# Patient Record
Sex: Female | Born: 1975 | Race: Asian | Hispanic: No | Marital: Single | Smoking: Former smoker
Health system: Southern US, Community
[De-identification: ages and names within clinical notes are randomized; demographics above are authoritative.]

## PROBLEM LIST (undated history)

## (undated) HISTORY — PX: BREAST SURGERY: SHX581

---

## 1999-08-17 HISTORY — PX: BREAST EXCISIONAL BIOPSY: SUR124

## 2009-06-30 ENCOUNTER — Ambulatory Visit: Payer: Self-pay | Admitting: Occupational Medicine

## 2009-06-30 DIAGNOSIS — D509 Iron deficiency anemia, unspecified: Secondary | ICD-10-CM | POA: Insufficient documentation

## 2009-06-30 DIAGNOSIS — J45909 Unspecified asthma, uncomplicated: Secondary | ICD-10-CM | POA: Insufficient documentation

## 2009-06-30 DIAGNOSIS — M79609 Pain in unspecified limb: Secondary | ICD-10-CM

## 2009-12-26 ENCOUNTER — Other Ambulatory Visit: Admission: RE | Admit: 2009-12-26 | Discharge: 2009-12-26 | Payer: Self-pay | Admitting: Family Medicine

## 2011-03-25 ENCOUNTER — Other Ambulatory Visit (HOSPITAL_COMMUNITY)
Admission: RE | Admit: 2011-03-25 | Discharge: 2011-03-25 | Disposition: A | Discharge status: Home / Self Care | Payer: BC Managed Care – PPO | Source: Ambulatory Visit | Attending: Obstetrics and Gynecology | Admitting: Obstetrics and Gynecology

## 2011-03-25 DIAGNOSIS — Z01419 Encounter for gynecological examination (general) (routine) without abnormal findings: Secondary | ICD-10-CM | POA: Insufficient documentation

## 2013-03-28 ENCOUNTER — Encounter: Payer: Self-pay | Admitting: Physical Medicine & Rehabilitation

## 2013-04-20 ENCOUNTER — Encounter: Payer: Self-pay | Admitting: Physical Medicine & Rehabilitation

## 2013-04-20 ENCOUNTER — Ambulatory Visit (HOSPITAL_BASED_OUTPATIENT_CLINIC_OR_DEPARTMENT_OTHER): Payer: Worker's Compensation | Admitting: Physical Medicine & Rehabilitation

## 2013-04-20 ENCOUNTER — Encounter: Payer: Worker's Compensation | Attending: Physical Medicine & Rehabilitation

## 2013-04-20 VITALS — BP 121/76 | HR 66 | Resp 14 | Ht 65.0 in | Wt 165.0 lb

## 2013-04-20 DIAGNOSIS — Y990 Civilian activity done for income or pay: Secondary | ICD-10-CM | POA: Insufficient documentation

## 2013-04-20 DIAGNOSIS — Y92009 Unspecified place in unspecified non-institutional (private) residence as the place of occurrence of the external cause: Secondary | ICD-10-CM | POA: Insufficient documentation

## 2013-04-20 DIAGNOSIS — M549 Dorsalgia, unspecified: Secondary | ICD-10-CM | POA: Insufficient documentation

## 2013-04-20 DIAGNOSIS — S336XXA Sprain of sacroiliac joint, initial encounter: Secondary | ICD-10-CM

## 2013-04-20 DIAGNOSIS — IMO0002 Reserved for concepts with insufficient information to code with codable children: Secondary | ICD-10-CM | POA: Insufficient documentation

## 2013-04-20 DIAGNOSIS — M545 Low back pain, unspecified: Secondary | ICD-10-CM | POA: Insufficient documentation

## 2013-04-20 DIAGNOSIS — G5702 Lesion of sciatic nerve, left lower limb: Secondary | ICD-10-CM | POA: Insufficient documentation

## 2013-04-20 DIAGNOSIS — IMO0001 Reserved for inherently not codable concepts without codable children: Secondary | ICD-10-CM | POA: Insufficient documentation

## 2013-04-20 DIAGNOSIS — M25559 Pain in unspecified hip: Secondary | ICD-10-CM | POA: Insufficient documentation

## 2013-04-20 DIAGNOSIS — Y93F2 Activity, caregiving, lifting: Secondary | ICD-10-CM | POA: Insufficient documentation

## 2013-04-20 DIAGNOSIS — G5700 Lesion of sciatic nerve, unspecified lower limb: Secondary | ICD-10-CM

## 2013-04-20 DIAGNOSIS — R209 Unspecified disturbances of skin sensation: Secondary | ICD-10-CM | POA: Insufficient documentation

## 2013-04-20 DIAGNOSIS — R42 Dizziness and giddiness: Secondary | ICD-10-CM | POA: Insufficient documentation

## 2013-04-20 DIAGNOSIS — X500XXA Overexertion from strenuous movement or load, initial encounter: Secondary | ICD-10-CM | POA: Insufficient documentation

## 2013-04-20 NOTE — Progress Notes (Signed)
Subjective:    Patient ID: Sharon Hahn, female    DOB: 1976-01-31, 37 y.o.   MRN: [id number] CC:  Left buttocks pain that radiates into the hip and then into the left heel. Onset 02/12/2013. 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 Pain started while attempting to transfer patient from supine to sit. The patient fell backwards, while trying to transfer and patient felt immediate onset of pain. The patient went to occupational health clinic [facility] for evaluation. Released to light duty on 02/13/2013. Patient was also treated with ibuprofen and Flexeril as well as moist heat. Had two prednisone dose packs which were of mild benefit  Has not worked since injury. No light work available. Overall symptoms have been improving. No physical therapy thus far.  Took 1 or 2 ibuprofen. Flexeril seems to work better Pain Inventory Average Pain 3 Pain Right Now 2 My pain is intermittent, aching and numbness  In the last 24 hours, has pain interfered with the following? General activity 1 Relation with others 0 Enjoyment of life 1 What TIME of day is your pain at its worst? daytime Sleep (in general) Good  Pain is worse with: bending and sitting Pain improves with: rest, heat/ice and medication Relief from Meds: 0  Mobility walk without assistance how many minutes can you walk? 10-15 ability to climb steps?  yes do you drive?  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  no MR SPINE LUMBAR WO CONTRAST7/31/2014  [facility]  MR SPINE LUMBAR WO CONTRAST7/31/2014  [facility]  Result Impression   No significant spinal canal or foraminal stenosis within the lumbar spine.   Result Narrative  MRI LUMBAR SPINE WITHOUT CONTRAST, Mar 15, 2013 11:05:00 PM . INDICATION:  Sciatica, unspecified  laterality724.3 Sciatica, unspecified laterality  COMPARISON: None. . TECHNIQUE: Multiplanar, multi-sequence surface-coil MR imaging of the lumbar spine was performed without contrast. . LEVELS IMAGED: Lower thoracic to upper sacral region . FINDINGS: .  Alignment: Normal. .  Vertebrae: No marrow signal abnormalities to suggest neoplasm. .  Conus: Conus terminates at L1..  Normal signal and contour.   .  Degenerative disc disease:. .  T12-L1: No significant focal abnormality. .  L1-L2: No significant focal abnormality. .  L2-L3: No significant focal abnormality. Marland Kitchen  L3-L4: Bilateral facet joint effusions. No significant stenosis. Marland Kitchen  L4-L5: Bilateral facet joint effusions. No significant stenosis.. .  L5-S1: No significant focal abnormality. Marland Kitchen  Upper Sacrum/Ilium: No significant focal abnormality.    Physicians involved in your care Any changes since last visit?  no   History reviewed. No pertinent family history. History   Social History  . Marital Status: Single    Spouse Name: N/A    Number of Children: N/A  . Years of Education: N/A   Social History Main Topics  . Smoking status: Former Games developer  . Smokeless tobacco: Never Used  . Alcohol Use: None  . Drug Use: None  . Sexual Activity: None   Other Topics Concern  . None   Social History Narrative  . None   No past surgical history on file. History reviewed. No pertinent past medical history. BP 121/76  Pulse 66  Resp 14  Ht 5\' 5"  (1.651 m)  Wt 165 lb (74.844 kg)  BMI 27.46 kg/m2  SpO2 100%     Review  of Systems  Musculoskeletal: Positive for back pain.  Neurological: Positive for dizziness and numbness.  All other systems reviewed and are negative.       Objective:   Physical Exam  Nursing note and vitals reviewed. Constitutional: She appears well-developed and well-nourished.  HENT:  Head: Normocephalic and atraumatic.  Eyes: Conjunctivae and EOM are normal. Pupils are equal, round, and  reactive to light.  Neck: Normal range of motion.  Musculoskeletal:       Lumbar back: She exhibits decreased range of motion and tenderness. She exhibits no edema and no deformity.  Tenderness over her left quadratus lumborum Tenderness over left gluteus medius Tenderness over left gluteus maximus plus minus piriformis Tenderness over left greater trochanter  Mild tenderness right gluteus medius Mild tenderness right greater trochanter  Lumbar spine range of motion 75% flexion 50% lateral bending to the left and to the right 50% extension  Psychiatric: She has a normal mood and affect.          Assessment & Plan:  1. Myofascial pain syndrome after work related injury. She has primarily muscle pain in the left gluteus region. Piriformis syndrome is a consideration because she has some sciatic type symptoms on the left side. MRI showed lumbar facet fluid bilateral L3-4 and L. 4- 5 which may have been related to the injury however certainly does not appear to have any symptoms at that level.  She has reduced lumbar range of motion which is secondary to deconditioning. She does not have pain limiting her end points. Recommended physical therapy for stretching strengthening and reconditioning. Goal is to return to work at full capacity.  If patient fails to respond to physical therapy as expected, may need trochanteric bursa injection plus minus sacroiliac injection  Light duty restrictions. 20 pound maximum occasional lifting. No sitting or standing restrictions. No upper extremity restrictions.  Physical therapy prescribed No need for repeat MRI or repeat imaging No need for injections at this point however if patient does not respond well to physical therapy may need to consider this Patient has not reached MMI  Case was discussed with patient as well as workers Science writer Rico Sheehan fax #[phone]

## 2013-04-20 NOTE — Patient Instructions (Addendum)
Light duty restrictions. 20 pound maximum occasional lifting. No sitting or standing restrictions. No upper extremity restrictions.  Physical therapy prescribed No need for repeat MRI or repeat imaging No need for injections at this point however if patient does not respond well to physical therapy may need to consider this Patient has not reached MMI

## 2013-05-11 ENCOUNTER — Encounter: Payer: Self-pay | Admitting: Physical Medicine & Rehabilitation

## 2013-05-11 ENCOUNTER — Ambulatory Visit (HOSPITAL_BASED_OUTPATIENT_CLINIC_OR_DEPARTMENT_OTHER): Payer: Worker's Compensation | Admitting: Physical Medicine & Rehabilitation

## 2013-05-11 VITALS — BP 112/67 | HR 62 | Resp 14 | Ht 65.0 in | Wt 168.4 lb

## 2013-05-11 DIAGNOSIS — S336XXD Sprain of sacroiliac joint, subsequent encounter: Secondary | ICD-10-CM

## 2013-05-11 DIAGNOSIS — Z5189 Encounter for other specified aftercare: Secondary | ICD-10-CM

## 2013-05-11 DIAGNOSIS — G5700 Lesion of sciatic nerve, unspecified lower limb: Secondary | ICD-10-CM

## 2013-05-11 DIAGNOSIS — S76012D Strain of muscle, fascia and tendon of left hip, subsequent encounter: Secondary | ICD-10-CM

## 2013-05-11 DIAGNOSIS — G5702 Lesion of sciatic nerve, left lower limb: Secondary | ICD-10-CM

## 2013-05-11 NOTE — Progress Notes (Signed)
Subjective:    Patient ID: Sharon Hahn, female    DOB: 01-03-1976, 37 y.o.   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 The patient fell backwards, while trying to transfer and patient felt immediate onset of pain.  The patient went to occupational health clinic [facility] for evaluation. Released to light duty on 02/13/2013. Patient was also treated with ibuprofen and Flexeril as well as moist heat. Had two prednisone dose packs which were of mild benefit    HPI Has had 8/9 physical therapy visits. Overall feeling better. Only occasional numbness in the left leg. Improving flexibility. Has returned to work on a light duty basis. She is just doing paper work. She states she would prefer going back to her regular job.  Rarely taking any cyclobenzaprine or ibuprofen.  Pain Inventory Average Pain 2 Pain Right Now 0 My pain is dull  In the last 24 hours, has pain interfered with the following? General activity 0 Relation with others 0 Enjoyment of life 0 What TIME of day is your pain at its worst? evening Sleep (in general) Good  Pain is worse with: some activites Pain improves with: heat/ice, therapy/exercise and TENS Relief from Meds: 0  Mobility walk without assistance how many minutes can you walk? 40 ability to climb steps?  yes do you drive?  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  no  Physicians involved in your care Any changes since last visit?  no   History reviewed. No pertinent family history. History   Social History  . Marital Status: Single    Spouse Name: N/A    Number of Children: N/A  . Years of Education: N/A   Social History Main Topics  . Smoking status: Former Games developer  . Smokeless tobacco: Never Used  . Alcohol Use: None   . Drug Use: None  . Sexual Activity: None   Other Topics Concern  . None   Social History Narrative  . None   History reviewed. No pertinent past surgical history. History reviewed. No pertinent past medical history. BP 112/67  Pulse 62  Resp 14  Ht 5\' 5"  (1.651 m)  Wt 168 lb 6.4 oz (76.386 kg)  BMI 28.02 kg/m2  SpO2 99%  LMP 05/11/2013     Review of Systems  Neurological: Positive for dizziness.       Objective:   Physical Exam  Nursing note and vitals reviewed.  Constitutional: She appears well-developed and well-nourished.  HENT:  Head: Normocephalic and atraumatic.  Eyes: Conjunctivae and EOM are normal. Pupils are equal, round, and reactive to light.  Neck: Normal range of motion.  Musculoskeletal:  Lumbar back: She exhibits decreased range of motion and tenderness. She exhibits no edema and no deformity.  No tenderness over her left quadratus lumborum Mild tenderness over left gluteus medius Mild tenderness over left gluteus maximus plus minus piriformis Mild/mod tenderness over left greater trochanter  No tenderness right gluteus medius No tenderness right greater trochanter  Lumbar spine range of motion 100% flexion 75% lateral bending to the left and to the right 50% extension  Psychiatric: She has a normal mood and affect.        Assessment & Plan:  1. Myofascial pain syndrome after work related injury. She has primarily muscle pain in the left  gluteus region. Piriformis syndrome is a consideration because she has some sciatic type symptoms on the left side. MRI showed lumbar facet fluid bilateral L3-4 and L. 4- 5 which may have been related to the injury however certainly does not appear to have any symptoms at that level.  She has reduced lumbar range of motion which is secondary to deconditioning and improving with therapy. She does not have pain limiting her end points.  Recommended continue physical therapy for work conditioning and works  simulation. Goal is to return to work at full capacity. May be ready after next visit If patient fails to respond to physical therapy as expected, may need trochanteric bursa injection plus minus sacroiliac injection  Light duty restrictions. 20 pound maximum occasional lifting. No sitting or standing restrictions. No upper extremity restrictions.  Physical therapy prescribed  No need for repeat MRI or repeat imaging  No need for injections at this point however if patient does not respond well to physical therapy may need to consider this  Patient has not reached MMI  Case was discussed with patient as well as workers compensation case manager Rico Sheehan fax #[phone] Over half of the 25 min visit was spent counseling and coordinating care.

## 2013-05-11 NOTE — Patient Instructions (Addendum)
Continue current restrictions at 20 pound occasional maximum lifting  We will discuss return to the usual duties next visit if you continue to make good progress.  I would like physical therapy to do some job stimulation

## 2013-05-25 ENCOUNTER — Ambulatory Visit (HOSPITAL_BASED_OUTPATIENT_CLINIC_OR_DEPARTMENT_OTHER): Payer: Worker's Compensation | Admitting: Physical Medicine & Rehabilitation

## 2013-05-25 ENCOUNTER — Encounter: Payer: Self-pay | Admitting: Physical Medicine & Rehabilitation

## 2013-05-25 ENCOUNTER — Encounter: Payer: BC Managed Care – PPO | Attending: Physical Medicine & Rehabilitation

## 2013-05-25 VITALS — BP 126/70 | HR 88 | Resp 14 | Ht 65.0 in | Wt 167.0 lb

## 2013-05-25 DIAGNOSIS — IMO0001 Reserved for inherently not codable concepts without codable children: Secondary | ICD-10-CM | POA: Insufficient documentation

## 2013-05-25 NOTE — Progress Notes (Signed)
Subjective:    Patient ID: Sharon Hahn, female    DOB: 01-29-1976, 37 y.o.   MRN: [id number]  HPI Finished physical therapy yesterday Now is doing independent exercise program Has been doing work conditioning as well as job stimulation with transfers and other activities  Has taken 1 Flexeril in the last 2 weeks Has taken ibuprofen twice in the last 2 weeks after her workout  Pain Inventory Average Pain 2 Pain Right Now 0 My pain is intermittent and sore  In the last 24 hours, has pain interfered with the following? General activity 0 Relation with others 0 Enjoyment of life 0 What TIME of day is your pain at its worst? evening Sleep (in general) Good  Pain is worse with: exercise Pain improves with: rest, heat/ice and therapy/exercise Relief from Meds: ibuprofen only as needed  Mobility walk without assistance how many minutes can you walk? 45 ability to climb steps?  yes do you drive?  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  no  Physicians involved in your care Any changes since last visit?  no   Family History  Problem Relation Age of Onset  . Heart disease Mother   . Hyperlipidemia Mother   . Hypertension Mother   . Heart disease Father   . Hyperlipidemia Father   . Hypertension Father    History   Social History  . Marital Status: Single    Spouse Name: N/A    Number of Children: N/A  . Years of Education: N/A   Social History Main Topics  . Smoking status: Former Games developer  . Smokeless tobacco: Never Used  . Alcohol Use: None  . Drug Use: None  . Sexual Activity: None   Other Topics Concern  . None   Social History Narrative  . None   Past Surgical History  Procedure Laterality Date  . Breast surgery     History reviewed. No pertinent past medical history. BP 126/70  Pulse 88  Resp 14  Ht 5\' 5"  (1.651 m)  Wt 167 lb (75.751 kg)   BMI 27.79 kg/m2  SpO2 100%  LMP 05/11/2013      Review of Systems  All other systems reviewed and are negative.       Objective:   Physical Exam  Nursing note and vitals reviewed.  Constitutional: She appears well-developed and well-nourished.  HENT:  Head: Normocephalic and atraumatic.  Eyes: Conjunctivae and EOM are normal. Pupils are equal, round, and reactive to light.  Neck: Normal range of motion.  Musculoskeletal:  Lumbar back: She exhibits decreased range of motion and tenderness. She exhibits no edema and no deformity.  No tenderness over her left quadratus lumborum no tenderness over left gluteus medius no tenderness over left gluteus maximus plus minus piriformis Mild tenderness over left greater trochanter  No tenderness right gluteus medius No tenderness right greater trochanter  Lumbar spine range of motion 100% flexion 75% lateral bending to the left and to the right 75% extension  Psychiatric: She has a normal mood and affect.        Assessment & Plan:  1. Myofascial pain syndrome after work related injury. She has primarily muscle pain in the left gluteus region. Marland Kitchen MRI showed lumbar facet fluid bilateral L3-4 and L. 4- 5 which may have been related to the injury however certainly does not appear to have any symptoms at that level.  She had reduced lumbar range of motion  improved with therapy. She does not have pain limiting her end points.  Recommended continue physical therapy for work conditioning and works simulation.   return to work at full capacity. 1/2 days for 1 week then resume normal schedule  Patient to call if unable to tolerate full schedule   No need for repeat MRI or repeat imaging  No need for injections at this point or physical therapy , continue home eercise program Case was discussed with patient as well as workers compensation case manager Rico Sheehan fax #[phone]  Over half of the 25 min visit was spent counseling  and coordinating care.  Marland Kitchen

## 2013-05-25 NOTE — Patient Instructions (Addendum)
Return to work 4 hour days without restrictions for 1 week Then resume normal schedule without restrictions  Return to clinic as needed if pain worsens  0% partial permanent disability rating  Discontinue Flexeril  May take ibuprofen 400 mg 3 times per day with food as needed

## 2013-08-23 ENCOUNTER — Other Ambulatory Visit (HOSPITAL_COMMUNITY)
Admission: RE | Admit: 2013-08-23 | Discharge: 2013-08-23 | Disposition: A | Discharge status: Home / Self Care | Payer: BC Managed Care – PPO | Source: Ambulatory Visit | Attending: Family Medicine | Admitting: Family Medicine

## 2013-08-23 ENCOUNTER — Other Ambulatory Visit: Payer: Self-pay | Admitting: Family Medicine

## 2013-08-23 DIAGNOSIS — Z113 Encounter for screening for infections with a predominantly sexual mode of transmission: Secondary | ICD-10-CM | POA: Insufficient documentation

## 2013-08-23 DIAGNOSIS — Z1151 Encounter for screening for human papillomavirus (HPV): Secondary | ICD-10-CM | POA: Insufficient documentation

## 2013-08-23 DIAGNOSIS — Z124 Encounter for screening for malignant neoplasm of cervix: Secondary | ICD-10-CM | POA: Insufficient documentation

## 2013-08-23 DIAGNOSIS — R87810 Cervical high risk human papillomavirus (HPV) DNA test positive: Secondary | ICD-10-CM | POA: Insufficient documentation

## 2013-08-29 ENCOUNTER — Other Ambulatory Visit: Payer: Self-pay | Admitting: Family Medicine

## 2013-08-29 DIAGNOSIS — R234 Changes in skin texture: Secondary | ICD-10-CM

## 2013-09-06 ENCOUNTER — Other Ambulatory Visit: Payer: Self-pay

## 2013-09-26 ENCOUNTER — Ambulatory Visit
Admission: RE | Admit: 2013-09-26 | Discharge: 2013-09-26 | Disposition: A | Discharge status: Home / Self Care | Payer: BC Managed Care – PPO | Source: Ambulatory Visit | Attending: Family Medicine | Admitting: Family Medicine

## 2013-09-26 DIAGNOSIS — R234 Changes in skin texture: Secondary | ICD-10-CM

## 2016-01-02 ENCOUNTER — Other Ambulatory Visit: Payer: Self-pay

## 2016-01-02 DIAGNOSIS — Z1231 Encounter for screening mammogram for malignant neoplasm of breast: Secondary | ICD-10-CM

## 2016-02-03 ENCOUNTER — Ambulatory Visit
Admission: RE | Admit: 2016-02-03 | Discharge: 2016-02-03 | Disposition: A | Discharge status: Home / Self Care | Payer: 59 | Source: Ambulatory Visit

## 2016-02-03 DIAGNOSIS — Z1231 Encounter for screening mammogram for malignant neoplasm of breast: Secondary | ICD-10-CM

## 2017-08-05 ENCOUNTER — Other Ambulatory Visit: Payer: Self-pay | Admitting: Family Medicine

## 2017-08-05 DIAGNOSIS — N644 Mastodynia: Secondary | ICD-10-CM

## 2017-08-15 ENCOUNTER — Ambulatory Visit
Admission: RE | Admit: 2017-08-15 | Discharge: 2017-08-15 | Disposition: A | Discharge status: Home / Self Care | Payer: 59 | Source: Ambulatory Visit | Attending: Family Medicine | Admitting: Family Medicine

## 2017-08-15 DIAGNOSIS — N644 Mastodynia: Secondary | ICD-10-CM

## 2018-03-06 ENCOUNTER — Other Ambulatory Visit (HOSPITAL_COMMUNITY)
Admission: RE | Admit: 2018-03-06 | Discharge: 2018-03-06 | Disposition: A | Discharge status: Home / Self Care | Payer: 59 | Source: Ambulatory Visit | Attending: Family Medicine | Admitting: Family Medicine

## 2018-03-06 ENCOUNTER — Other Ambulatory Visit: Payer: Self-pay | Admitting: Family Medicine

## 2018-03-06 DIAGNOSIS — Z01411 Encounter for gynecological examination (general) (routine) with abnormal findings: Secondary | ICD-10-CM | POA: Diagnosis present

## 2018-03-08 LAB — CYTOLOGY - PAP: Diagnosis: NEGATIVE

## 2018-10-25 ENCOUNTER — Other Ambulatory Visit: Payer: Self-pay | Admitting: Family Medicine

## 2018-10-25 DIAGNOSIS — Z1231 Encounter for screening mammogram for malignant neoplasm of breast: Secondary | ICD-10-CM

## 2018-11-15 ENCOUNTER — Other Ambulatory Visit: Payer: Self-pay | Admitting: Family Medicine

## 2018-11-15 DIAGNOSIS — Z1231 Encounter for screening mammogram for malignant neoplasm of breast: Secondary | ICD-10-CM

## 2019-01-03 ENCOUNTER — Other Ambulatory Visit: Payer: Self-pay

## 2019-01-03 ENCOUNTER — Ambulatory Visit
Admission: RE | Admit: 2019-01-03 | Discharge: 2019-01-03 | Disposition: A | Discharge status: Home / Self Care | Payer: 59 | Source: Ambulatory Visit | Attending: Family Medicine | Admitting: Family Medicine

## 2019-01-03 DIAGNOSIS — Z1231 Encounter for screening mammogram for malignant neoplasm of breast: Secondary | ICD-10-CM

## 2019-01-09 ENCOUNTER — Ambulatory Visit: Payer: Self-pay

## 2020-06-19 DIAGNOSIS — Z20822 Contact with and (suspected) exposure to covid-19: Secondary | ICD-10-CM | POA: Diagnosis not present

## 2020-07-23 DIAGNOSIS — Z23 Encounter for immunization: Secondary | ICD-10-CM | POA: Diagnosis not present

## 2020-07-23 DIAGNOSIS — Z8639 Personal history of other endocrine, nutritional and metabolic disease: Secondary | ICD-10-CM | POA: Diagnosis not present

## 2020-07-23 DIAGNOSIS — Z0000 Encounter for general adult medical examination without abnormal findings: Secondary | ICD-10-CM | POA: Diagnosis not present

## 2021-06-01 ENCOUNTER — Other Ambulatory Visit: Payer: Self-pay | Admitting: Family Medicine

## 2021-06-01 DIAGNOSIS — Z1231 Encounter for screening mammogram for malignant neoplasm of breast: Secondary | ICD-10-CM

## 2021-06-05 DIAGNOSIS — L709 Acne, unspecified: Secondary | ICD-10-CM | POA: Diagnosis not present

## 2021-06-05 DIAGNOSIS — N644 Mastodynia: Secondary | ICD-10-CM | POA: Diagnosis not present

## 2021-06-05 DIAGNOSIS — M549 Dorsalgia, unspecified: Secondary | ICD-10-CM | POA: Diagnosis not present

## 2021-06-05 DIAGNOSIS — Z23 Encounter for immunization: Secondary | ICD-10-CM | POA: Diagnosis not present

## 2021-06-12 ENCOUNTER — Other Ambulatory Visit: Payer: Self-pay | Admitting: Family Medicine

## 2021-06-12 DIAGNOSIS — N644 Mastodynia: Secondary | ICD-10-CM

## 2021-07-01 ENCOUNTER — Ambulatory Visit
Admission: RE | Admit: 2021-07-01 | Discharge: 2021-07-01 | Disposition: A | Discharge status: Home / Self Care | Payer: Self-pay | Source: Ambulatory Visit | Attending: Family Medicine | Admitting: Family Medicine

## 2021-07-01 ENCOUNTER — Other Ambulatory Visit: Payer: Self-pay

## 2021-07-01 ENCOUNTER — Ambulatory Visit: Admission: RE | Admit: 2021-07-01 | Payer: Self-pay | Source: Ambulatory Visit

## 2021-07-01 DIAGNOSIS — R922 Inconclusive mammogram: Secondary | ICD-10-CM | POA: Diagnosis not present

## 2021-07-01 DIAGNOSIS — N644 Mastodynia: Secondary | ICD-10-CM | POA: Diagnosis not present

## 2021-10-02 DIAGNOSIS — Z0000 Encounter for general adult medical examination without abnormal findings: Secondary | ICD-10-CM | POA: Diagnosis not present

## 2021-10-06 DIAGNOSIS — E559 Vitamin D deficiency, unspecified: Secondary | ICD-10-CM | POA: Diagnosis not present

## 2021-10-06 DIAGNOSIS — I10 Essential (primary) hypertension: Secondary | ICD-10-CM | POA: Diagnosis not present

## 2022-01-18 DIAGNOSIS — G5600 Carpal tunnel syndrome, unspecified upper limb: Secondary | ICD-10-CM | POA: Diagnosis not present

## 2022-01-18 DIAGNOSIS — L723 Sebaceous cyst: Secondary | ICD-10-CM | POA: Diagnosis not present

## 2022-05-24 IMAGING — MG DIGITAL DIAGNOSTIC BILAT W/ TOMO W/ CAD
8 series · 8 of 24 positions shown · non-contrast
Comparison: Previous exam(s).

CLINICAL DATA: Nonfocal left breast pain.

EXAM:
DIGITAL DIAGNOSTIC BILATERAL MAMMOGRAM WITH TOMOSYNTHESIS AND CAD
TECHNIQUE: Bilateral digital diagnostic mammography and breast tomosynthesis
was performed. The images were evaluated with computer-aided
detection.

[L MLO synth-2D]
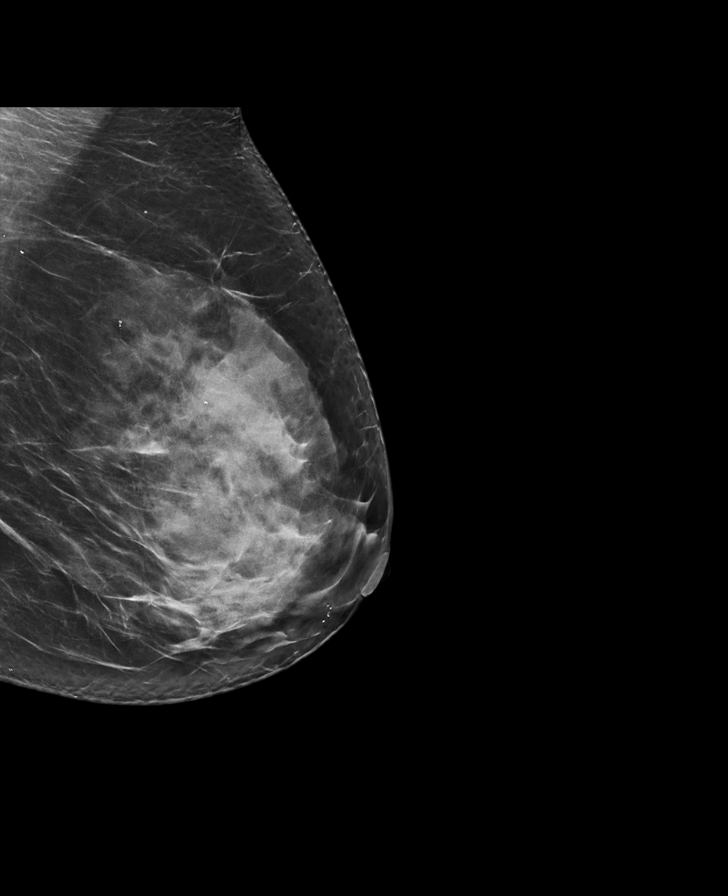

[R CC synth-2D]
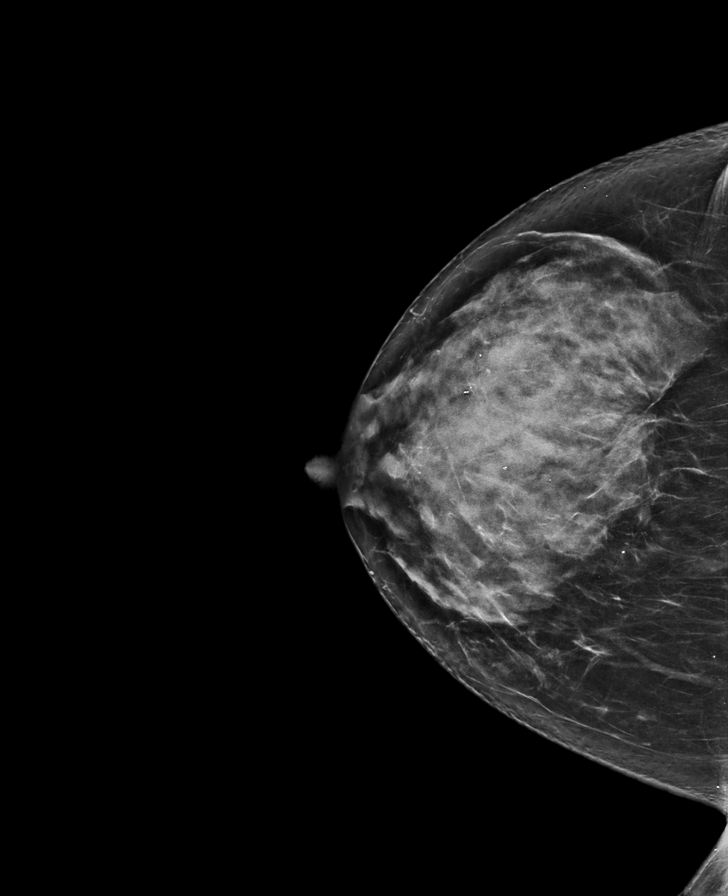

[L CC synth-2D]
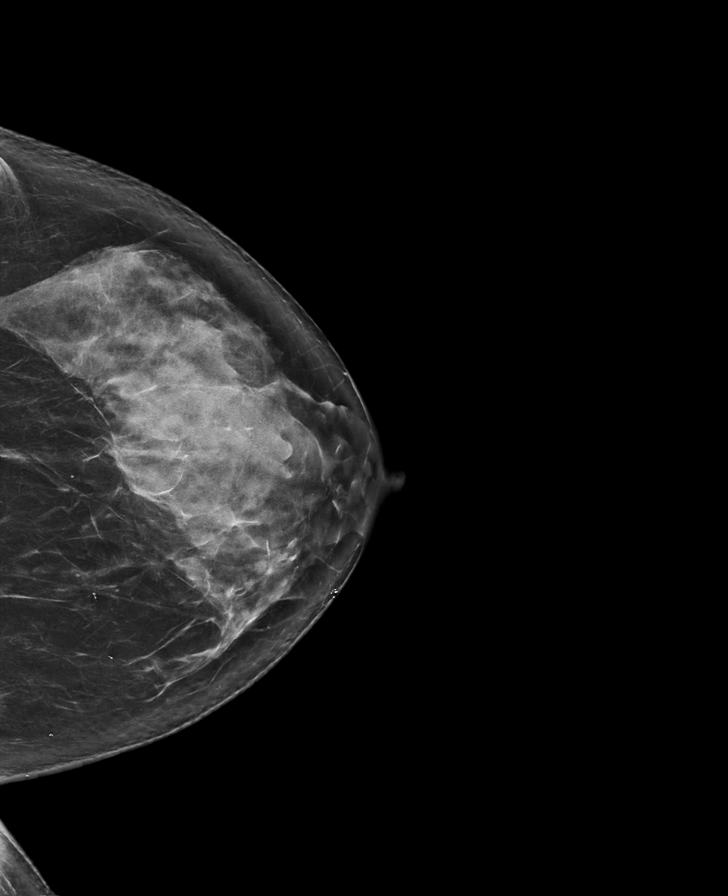

[R MLO synth-2D]
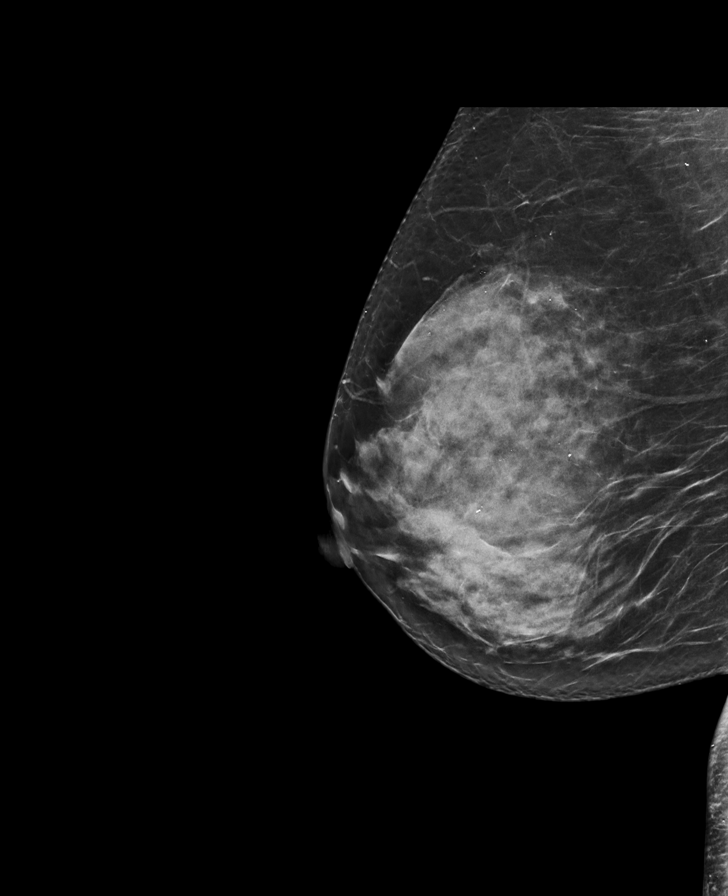

[L CC tomo · tomo slice 41/82.0]
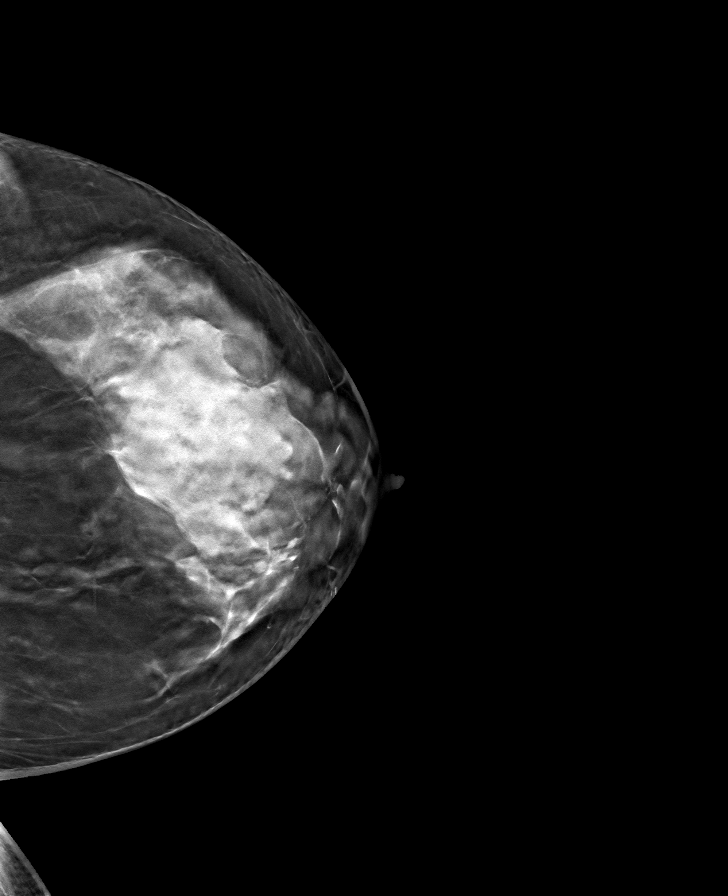

[R CC tomo · tomo slice 38/75.0]
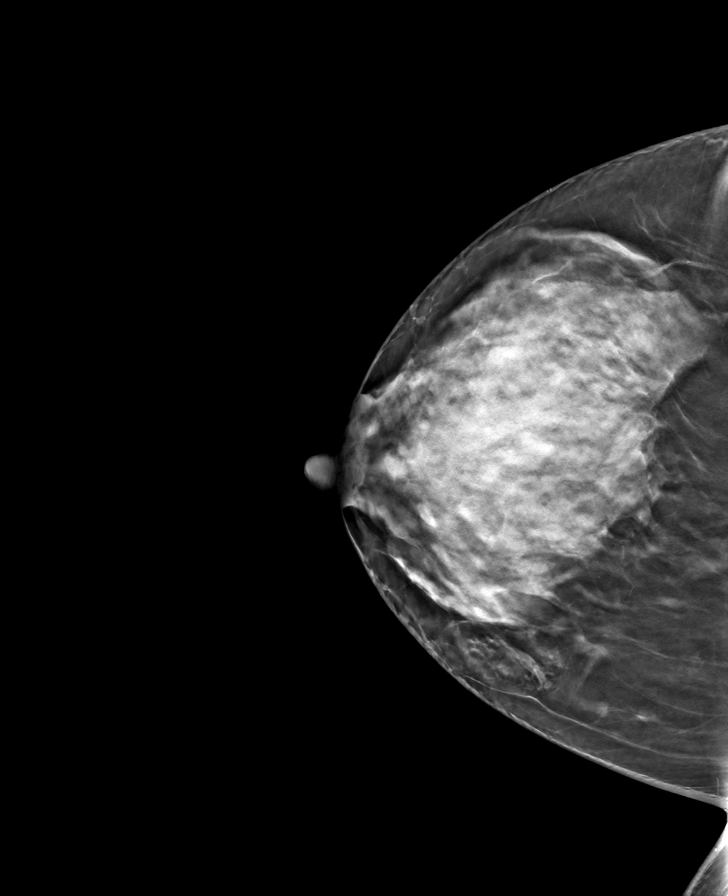

[L MLO tomo · tomo slice 43/84.0]
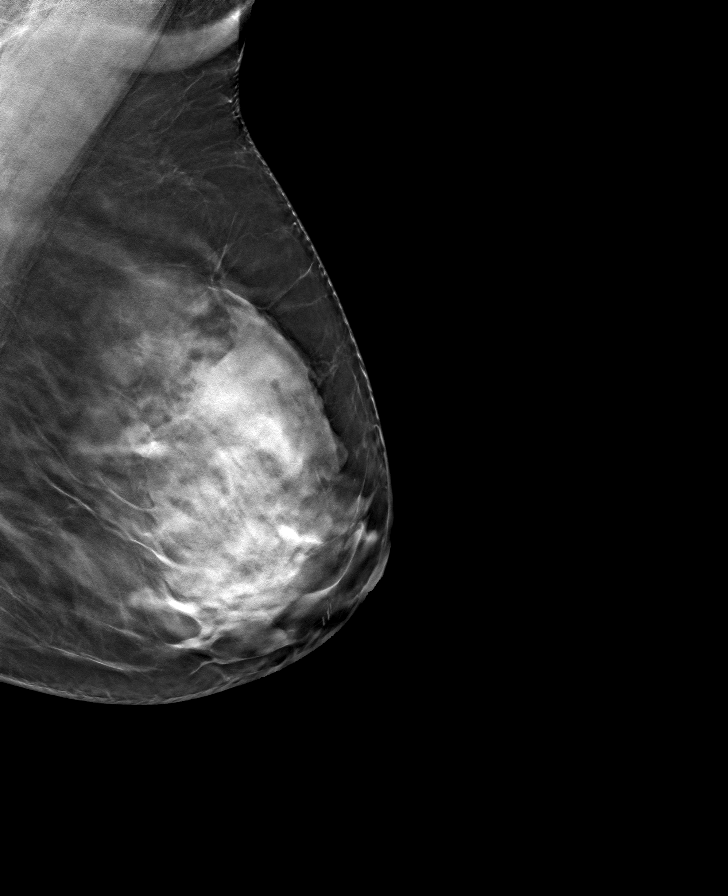

[R MLO tomo · tomo slice 39/77.0]
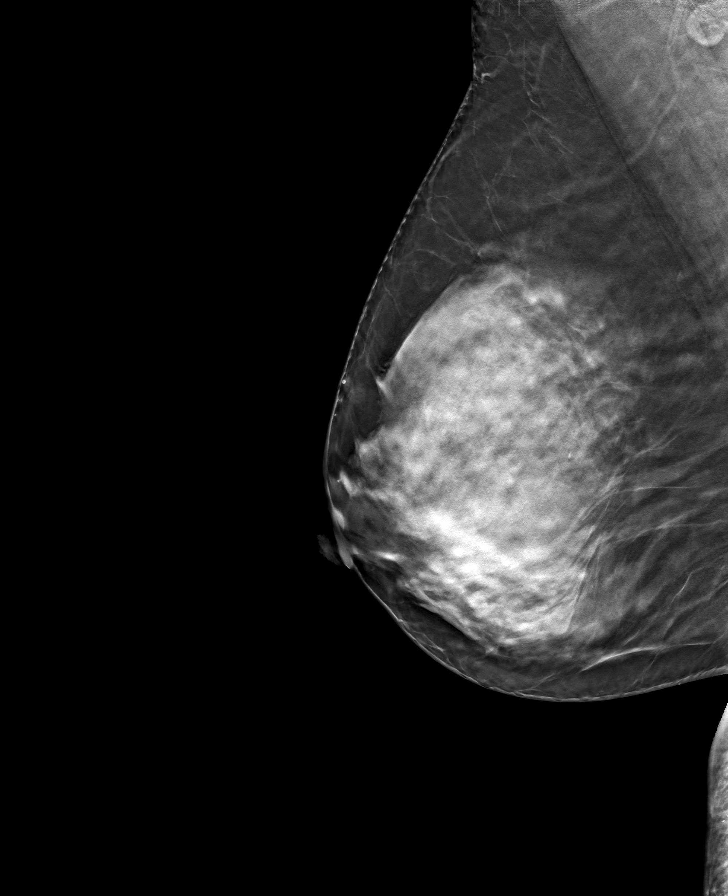

[8 of 24 positions shown; findings below may reference images not displayed]

ACR Breast Density Category c: The breast tissue is heterogeneously
dense, which may obscure small masses.
FINDINGS: No suspicious masses, calcifications, or distortion are identified
in either breast. No interval changes.
IMPRESSION: No mammographic evidence of malignancy.

RECOMMENDATION:
Treatment of the patient's symptoms should be based on clinical and
physical exam given lack of imaging findings. Recommend annual
screening mammography.

I have discussed the findings and recommendations with the patient.
If applicable, a reminder letter will be sent to the patient
regarding the next appointment.

BI-RADS CATEGORY  2: Benign.

## 2022-09-15 ENCOUNTER — Other Ambulatory Visit: Payer: Self-pay | Admitting: Family Medicine

## 2022-09-15 DIAGNOSIS — Z1231 Encounter for screening mammogram for malignant neoplasm of breast: Secondary | ICD-10-CM

## 2022-10-11 ENCOUNTER — Other Ambulatory Visit: Payer: Self-pay | Admitting: Family Medicine

## 2022-10-11 ENCOUNTER — Other Ambulatory Visit (HOSPITAL_COMMUNITY)
Admission: RE | Admit: 2022-10-11 | Discharge: 2022-10-11 | Disposition: A | Discharge status: Home / Self Care | Payer: BC Managed Care – PPO | Source: Ambulatory Visit | Attending: Family Medicine | Admitting: Family Medicine

## 2022-10-11 DIAGNOSIS — I10 Essential (primary) hypertension: Secondary | ICD-10-CM | POA: Diagnosis not present

## 2022-10-11 DIAGNOSIS — Z01411 Encounter for gynecological examination (general) (routine) with abnormal findings: Secondary | ICD-10-CM | POA: Diagnosis not present

## 2022-10-11 DIAGNOSIS — N920 Excessive and frequent menstruation with regular cycle: Secondary | ICD-10-CM | POA: Diagnosis not present

## 2022-10-11 DIAGNOSIS — E559 Vitamin D deficiency, unspecified: Secondary | ICD-10-CM | POA: Diagnosis not present

## 2022-10-11 DIAGNOSIS — Z0000 Encounter for general adult medical examination without abnormal findings: Secondary | ICD-10-CM | POA: Diagnosis not present

## 2022-10-18 LAB — CYTOLOGY - PAP
Comment: NEGATIVE
Diagnosis: NEGATIVE
High risk HPV: NEGATIVE

## 2022-10-19 ENCOUNTER — Ambulatory Visit
Admission: RE | Admit: 2022-10-19 | Discharge: 2022-10-19 | Disposition: A | Discharge status: Home / Self Care | Payer: BC Managed Care – PPO | Source: Ambulatory Visit | Attending: Family Medicine | Admitting: Family Medicine

## 2022-10-19 DIAGNOSIS — Z1231 Encounter for screening mammogram for malignant neoplasm of breast: Secondary | ICD-10-CM

## 2022-10-21 DIAGNOSIS — N9089 Other specified noninflammatory disorders of vulva and perineum: Secondary | ICD-10-CM | POA: Diagnosis not present

## 2022-11-10 DIAGNOSIS — A630 Anogenital (venereal) warts: Secondary | ICD-10-CM | POA: Diagnosis not present

## 2022-11-10 DIAGNOSIS — N9089 Other specified noninflammatory disorders of vulva and perineum: Secondary | ICD-10-CM | POA: Diagnosis not present

## 2022-11-10 DIAGNOSIS — L918 Other hypertrophic disorders of the skin: Secondary | ICD-10-CM | POA: Diagnosis not present

## 2022-11-16 DIAGNOSIS — I10 Essential (primary) hypertension: Secondary | ICD-10-CM | POA: Diagnosis not present

## 2022-11-25 DIAGNOSIS — A630 Anogenital (venereal) warts: Secondary | ICD-10-CM | POA: Diagnosis not present

## 2022-12-17 DIAGNOSIS — A630 Anogenital (venereal) warts: Secondary | ICD-10-CM | POA: Diagnosis not present

## 2023-01-11 DIAGNOSIS — M19071 Primary osteoarthritis, right ankle and foot: Secondary | ICD-10-CM | POA: Diagnosis not present

## 2023-01-11 DIAGNOSIS — M25571 Pain in right ankle and joints of right foot: Secondary | ICD-10-CM | POA: Diagnosis not present

## 2023-01-24 DIAGNOSIS — A630 Anogenital (venereal) warts: Secondary | ICD-10-CM | POA: Diagnosis not present

## 2023-02-23 DIAGNOSIS — A630 Anogenital (venereal) warts: Secondary | ICD-10-CM | POA: Diagnosis not present

## 2023-09-08 DIAGNOSIS — J069 Acute upper respiratory infection, unspecified: Secondary | ICD-10-CM | POA: Diagnosis not present

## 2023-09-21 DIAGNOSIS — J019 Acute sinusitis, unspecified: Secondary | ICD-10-CM | POA: Diagnosis not present

## 2023-11-01 DIAGNOSIS — I10 Essential (primary) hypertension: Secondary | ICD-10-CM | POA: Diagnosis not present

## 2023-11-01 DIAGNOSIS — E559 Vitamin D deficiency, unspecified: Secondary | ICD-10-CM | POA: Diagnosis not present

## 2023-11-01 DIAGNOSIS — Z0000 Encounter for general adult medical examination without abnormal findings: Secondary | ICD-10-CM | POA: Diagnosis not present

## 2023-11-01 DIAGNOSIS — Z79899 Other long term (current) drug therapy: Secondary | ICD-10-CM | POA: Diagnosis not present

## 2023-12-16 DIAGNOSIS — A630 Anogenital (venereal) warts: Secondary | ICD-10-CM | POA: Diagnosis not present

## 2024-01-03 DIAGNOSIS — A630 Anogenital (venereal) warts: Secondary | ICD-10-CM | POA: Diagnosis not present

## 2024-01-03 DIAGNOSIS — Z30011 Encounter for initial prescription of contraceptive pills: Secondary | ICD-10-CM | POA: Diagnosis not present

## 2024-02-07 DIAGNOSIS — A630 Anogenital (venereal) warts: Secondary | ICD-10-CM | POA: Diagnosis not present

## 2024-02-23 DIAGNOSIS — A630 Anogenital (venereal) warts: Secondary | ICD-10-CM | POA: Diagnosis not present

## 2024-03-27 DIAGNOSIS — W57XXXA Bitten or stung by nonvenomous insect and other nonvenomous arthropods, initial encounter: Secondary | ICD-10-CM | POA: Diagnosis not present

## 2024-03-27 DIAGNOSIS — S80861A Insect bite (nonvenomous), right lower leg, initial encounter: Secondary | ICD-10-CM | POA: Diagnosis not present

## 2024-03-28 DIAGNOSIS — A630 Anogenital (venereal) warts: Secondary | ICD-10-CM | POA: Diagnosis not present

## 2024-04-11 DIAGNOSIS — A630 Anogenital (venereal) warts: Secondary | ICD-10-CM | POA: Diagnosis not present
